# Patient Record
Sex: Male | Born: 1965 | Race: Black or African American | Hispanic: No | Marital: Married | Smoking: Never smoker
Health system: Southern US, Community
[De-identification: ages and names within clinical notes are randomized; demographics above are authoritative.]

## PROBLEM LIST (undated history)

## (undated) DIAGNOSIS — N4340 Spermatocele of epididymis, unspecified: Secondary | ICD-10-CM

## (undated) HISTORY — PX: SPERMATOCELECTOMY: SHX2420

---

## 2004-10-28 ENCOUNTER — Ambulatory Visit (HOSPITAL_COMMUNITY): Admission: RE | Admit: 2004-10-28 | Discharge: 2004-10-28 | Payer: Self-pay | Admitting: Neurosurgery

## 2005-10-19 ENCOUNTER — Emergency Department (HOSPITAL_COMMUNITY): Admission: EM | Admit: 2005-10-19 | Discharge: 2005-10-19 | Payer: Self-pay | Admitting: Emergency Medicine

## 2007-10-19 ENCOUNTER — Ambulatory Visit (HOSPITAL_COMMUNITY): Admission: RE | Admit: 2007-10-19 | Discharge: 2007-10-19 | Payer: Self-pay | Admitting: Internal Medicine

## 2011-02-07 ENCOUNTER — Emergency Department (HOSPITAL_COMMUNITY): Payer: 59

## 2011-02-07 ENCOUNTER — Emergency Department (HOSPITAL_COMMUNITY)
Admission: EM | Admit: 2011-02-07 | Discharge: 2011-02-07 | Disposition: A | Discharge status: Home / Self Care | Payer: 59 | Attending: Emergency Medicine | Admitting: Emergency Medicine

## 2011-02-07 DIAGNOSIS — R112 Nausea with vomiting, unspecified: Secondary | ICD-10-CM | POA: Insufficient documentation

## 2011-02-07 DIAGNOSIS — R1013 Epigastric pain: Secondary | ICD-10-CM | POA: Insufficient documentation

## 2011-02-07 LAB — COMPREHENSIVE METABOLIC PANEL
AST: 23 U/L (ref 0–37)
Alkaline Phosphatase: 58 U/L (ref 39–117)
BUN: 13 mg/dL (ref 6–23)
CO2: 30 mEq/L (ref 19–32)
Calcium: 9.8 mg/dL (ref 8.4–10.5)
Chloride: 104 mEq/L (ref 96–112)
Glucose, Bld: 98 mg/dL (ref 70–99)
Potassium: 3.8 mEq/L (ref 3.5–5.1)
Sodium: 140 mEq/L (ref 135–145)
Total Bilirubin: 0.6 mg/dL (ref 0.3–1.2)
Total Protein: 7.9 g/dL (ref 6.0–8.3)

## 2011-02-07 LAB — URINALYSIS, ROUTINE W REFLEX MICROSCOPIC
Ketones, ur: NEGATIVE mg/dL
Leukocytes, UA: NEGATIVE
Protein, ur: 30 mg/dL — AB
Specific Gravity, Urine: 1.022 (ref 1.005–1.030)
Urobilinogen, UA: 0.2 mg/dL (ref 0.0–1.0)

## 2011-02-07 LAB — CBC
HCT: 42.8 % (ref 39.0–52.0)
Hemoglobin: 14.1 g/dL (ref 13.0–17.0)
MCV: 80.6 fL (ref 78.0–100.0)
Platelets: 185 10*3/uL (ref 150–400)
RBC: 5.31 MIL/uL (ref 4.22–5.81)

## 2011-02-07 LAB — DIFFERENTIAL
Basophils Relative: 0 % (ref 0–1)
Lymphocytes Relative: 18 % (ref 12–46)
Monocytes Relative: 7 % (ref 3–12)

## 2011-02-07 LAB — URINE MICROSCOPIC-ADD ON

## 2013-03-02 IMAGING — US US ABDOMEN COMPLETE
1 series · 14 of 25 positions shown · non-contrast
Comparison: None.

CLINICAL DATA: Epigastric pain and nausea and vomiting.

COMPLETE ABDOMINAL ULTRASOUND

[Series 1: us abdomen complete · 0.28mm/px · 14 of 54 slices shown]
[im 1/54]
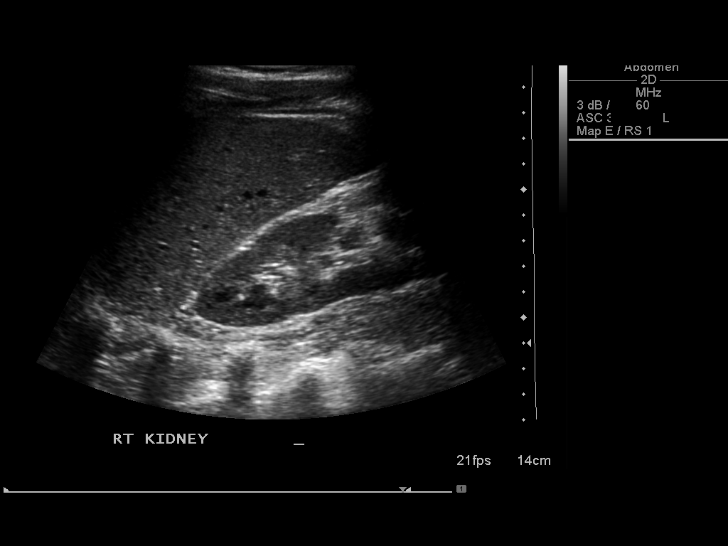
[im 5/54]
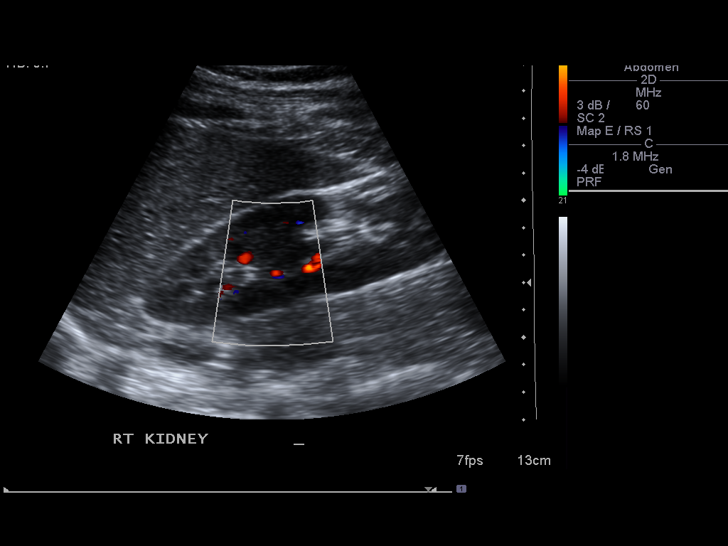
[im 9/54]
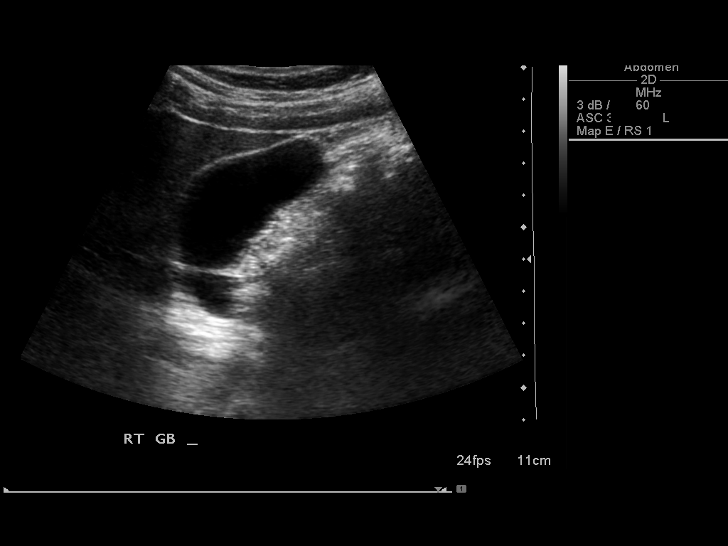
[im 14/54]
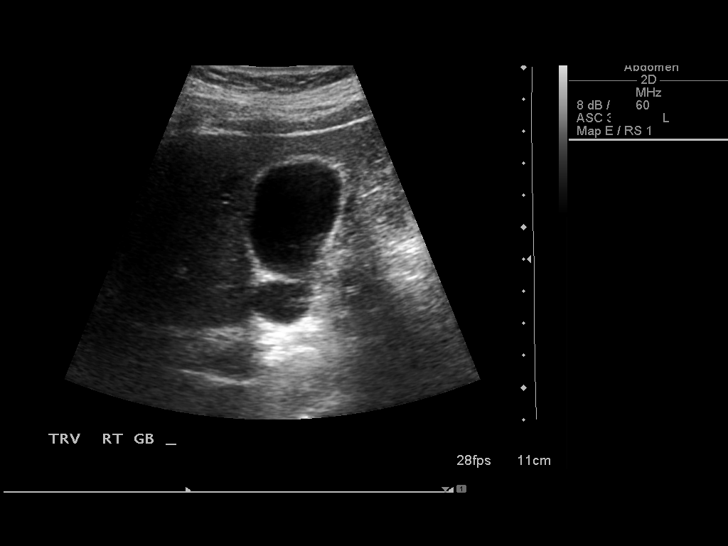
[im 18/54]
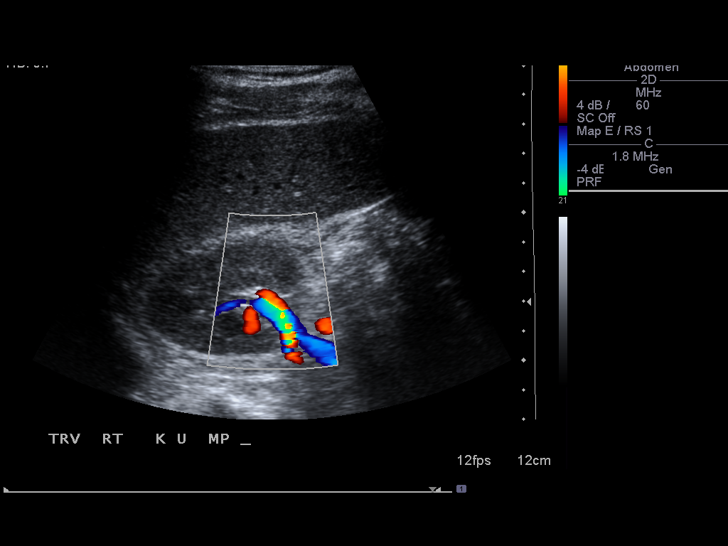
[im 20/54]
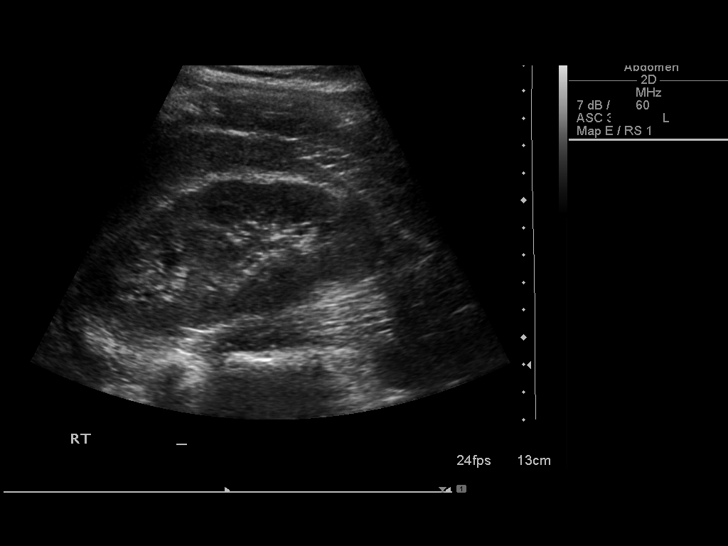
[im 25/54]
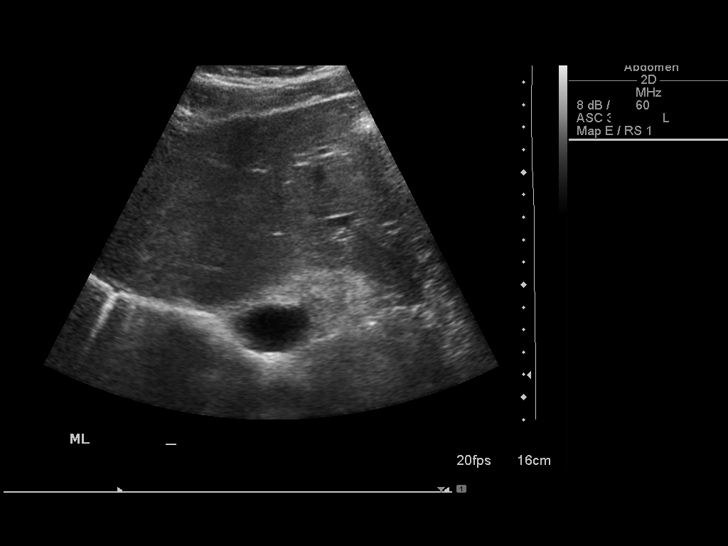
[im 29/54]
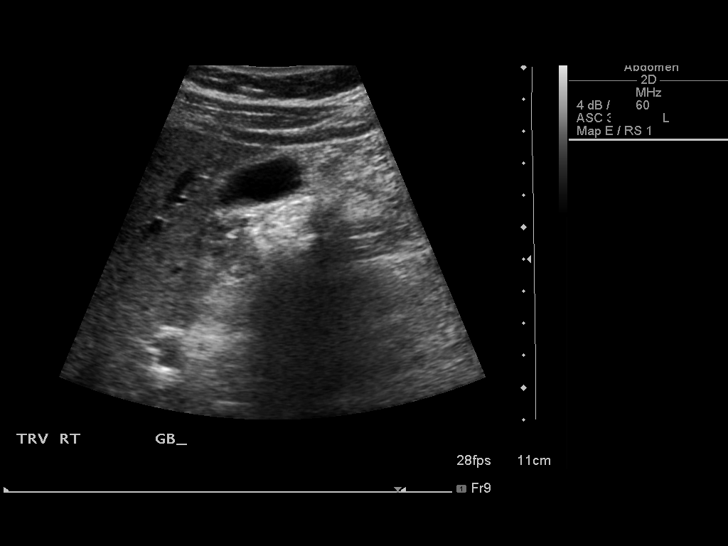
[im 34/54]
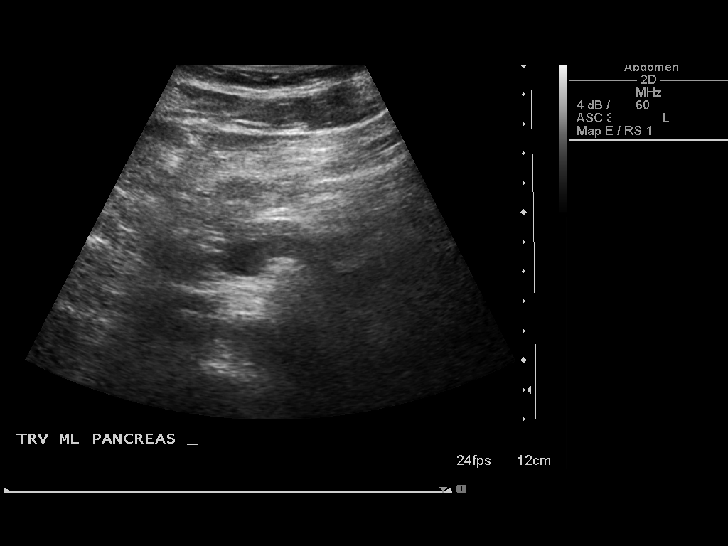
[im 36/54]
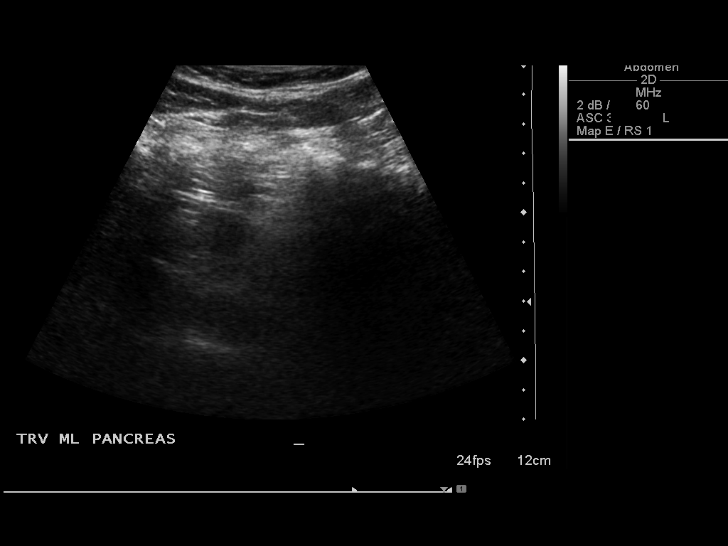
[im 40/54]
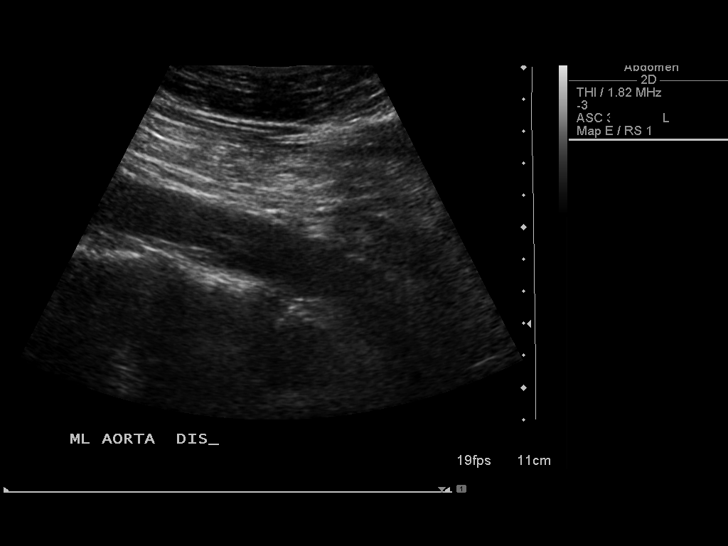
[im 45/54]
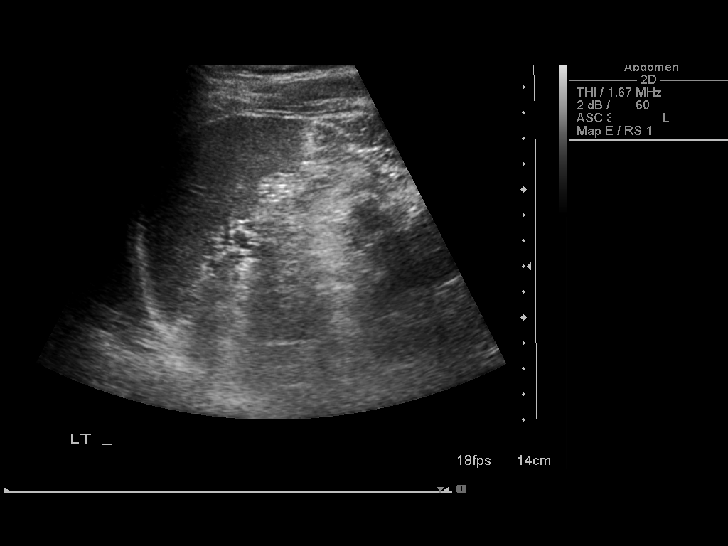
[im 49/54]
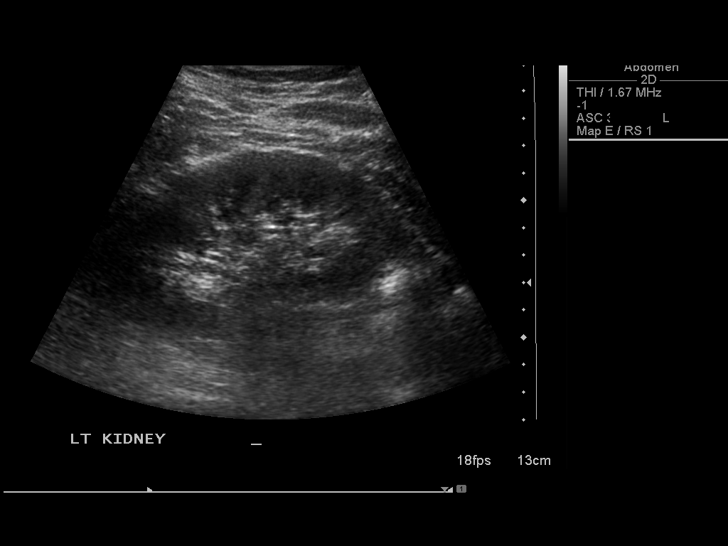
[im 54/54]
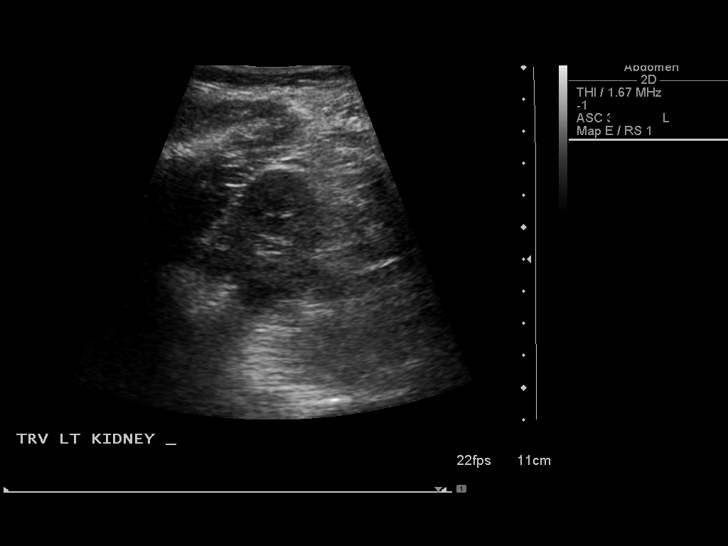

[14 of 25 positions shown; findings below may reference images not displayed]

FINDINGS: Gallbladder:  No gallstones, gallbladder wall thickening, or
pericholecystic fluid. Negative sonographic Murphy's sign.

Common bile duct:  Normal.  3 mm in diameter.

Liver:  No focal lesion identified.  Within normal limits in
parenchymal echogenicity.

IVC:  Appears normal.

Pancreas:  Head and body of the pancreas are normal.  The tail is
obscured by bowel gas.

Spleen:  Normal.  6.9 cm in length.

Right Kidney:  Normal.  11.6 cm in length.

Left Kidney:  12.5 cm in length.

Abdominal aorta:  Normal.  2.1 cm maximum diameter.
IMPRESSION: Normal exam.  The tail the pancreas is not visualized.

## 2019-06-22 ENCOUNTER — Other Ambulatory Visit: Payer: Self-pay

## 2021-12-28 ENCOUNTER — Other Ambulatory Visit (HOSPITAL_BASED_OUTPATIENT_CLINIC_OR_DEPARTMENT_OTHER): Payer: Self-pay

## 2022-08-25 ENCOUNTER — Other Ambulatory Visit: Payer: Self-pay

## 2022-08-25 ENCOUNTER — Encounter (HOSPITAL_BASED_OUTPATIENT_CLINIC_OR_DEPARTMENT_OTHER): Payer: Self-pay

## 2022-08-25 ENCOUNTER — Emergency Department (HOSPITAL_BASED_OUTPATIENT_CLINIC_OR_DEPARTMENT_OTHER)
Admission: EM | Admit: 2022-08-25 | Discharge: 2022-08-25 | Disposition: A | Discharge status: Home / Self Care | Payer: Managed Care, Other (non HMO) | Attending: Emergency Medicine | Admitting: Emergency Medicine

## 2022-08-25 DIAGNOSIS — R109 Unspecified abdominal pain: Secondary | ICD-10-CM | POA: Diagnosis present

## 2022-08-25 DIAGNOSIS — R1084 Generalized abdominal pain: Secondary | ICD-10-CM | POA: Insufficient documentation

## 2022-08-25 DIAGNOSIS — R112 Nausea with vomiting, unspecified: Secondary | ICD-10-CM | POA: Insufficient documentation

## 2022-08-25 HISTORY — DX: Spermatocele of epididymis, unspecified: N43.40

## 2022-08-25 LAB — CBC WITH DIFFERENTIAL/PLATELET
Abs Immature Granulocytes: 0.01 10*3/uL (ref 0.00–0.07)
Basophils Absolute: 0 10*3/uL (ref 0.0–0.1)
Basophils Relative: 0 %
Eosinophils Absolute: 0 10*3/uL (ref 0.0–0.5)
Eosinophils Relative: 0 %
HCT: 44.3 % (ref 39.0–52.0)
Hemoglobin: 14.1 g/dL (ref 13.0–17.0)
Immature Granulocytes: 0 %
Lymphocytes Relative: 15 %
Lymphs Abs: 0.8 10*3/uL (ref 0.7–4.0)
MCH: 26.3 pg (ref 26.0–34.0)
MCHC: 31.8 g/dL (ref 30.0–36.0)
MCV: 82.5 fL (ref 80.0–100.0)
Monocytes Absolute: 0.5 10*3/uL (ref 0.1–1.0)
Monocytes Relative: 9 %
Neutro Abs: 3.8 10*3/uL (ref 1.7–7.7)
Neutrophils Relative %: 76 %
Platelets: 173 10*3/uL (ref 150–400)
RBC: 5.37 MIL/uL (ref 4.22–5.81)
RDW: 13.5 % (ref 11.5–15.5)
WBC: 5.1 10*3/uL (ref 4.0–10.5)
nRBC: 0 % (ref 0.0–0.2)

## 2022-08-25 LAB — COMPREHENSIVE METABOLIC PANEL
ALT: 36 U/L (ref 0–44)
AST: 30 U/L (ref 15–41)
Albumin: 4.6 g/dL (ref 3.5–5.0)
Alkaline Phosphatase: 43 U/L (ref 38–126)
Anion gap: 8 (ref 5–15)
BUN: 12 mg/dL (ref 6–20)
CO2: 27 mmol/L (ref 22–32)
Calcium: 9.4 mg/dL (ref 8.9–10.3)
Chloride: 106 mmol/L (ref 98–111)
Creatinine, Ser: 1.08 mg/dL (ref 0.61–1.24)
GFR, Estimated: >60 mL/min (ref >60)
Glucose, Bld: 114 mg/dL — ABNORMAL HIGH (ref 70–99)
Potassium: 3.7 mmol/L (ref 3.5–5.1)
Sodium: 141 mmol/L (ref 135–145)
Total Bilirubin: 0.3 mg/dL (ref 0.3–1.2)
Total Protein: 7.5 g/dL (ref 6.5–8.1)

## 2022-08-25 LAB — LIPASE, BLOOD: Lipase: 13 U/L (ref 11–51)

## 2022-08-25 MED ORDER — MORPHINE SULFATE (PF) 4 MG/ML IV SOLN
4.0000 mg | Freq: Once | INTRAVENOUS | Status: AC
  Administered 2022-08-25: 4 mg via INTRAVENOUS
  Filled 2022-08-25: qty 1

## 2022-08-25 MED ORDER — ONDANSETRON HCL 4 MG/2ML IJ SOLN
4.0000 mg | Freq: Once | INTRAMUSCULAR | Status: AC
  Administered 2022-08-25: 4 mg via INTRAVENOUS
  Filled 2022-08-25: qty 2

## 2022-08-25 MED ORDER — DICYCLOMINE HCL 20 MG PO TABS: 20.0000 mg | ORAL_TABLET | Freq: Two times a day (BID) | ORAL | 1 refills | Status: AC

## 2022-08-25 MED ORDER — SODIUM CHLORIDE 0.9 % IV BOLUS
1000.0000 mL | Freq: Once | INTRAVENOUS | Status: AC
  Administered 2022-08-25: 1000 mL via INTRAVENOUS

## 2022-08-25 NOTE — ED Notes (Signed)
Patient verbalizes understanding of discharge instructions. Opportunity for questioning and answers were provided. Armband removed by staff, pt discharged from ED. Ambulated out to lobby with wife

## 2022-08-25 NOTE — ED Provider Notes (Signed)
[facility] Provider Note   CSN: [id number] Arrival date & time: 08/25/22  1618     History  Chief Complaint  Patient presents with   COVID +   Abdominal Pain    Steven Young is a 57 y.o. male with a past medical history of hyperlipidemia presenting today with complaint of generalized abdominal pain that is worse in the epigastrium.  He reports that the pain started on Sunday.  On Saturday he had URI symptoms and was diagnosed with COVID.  He was prescribed Paxlovid and shortly after taking the Paxlovid dose he started to experience some abdominal pain.  At that time he was having no nausea, vomiting or diarrhea.  He has continued to have normal stools up until earlier today when he had some episodes of loose stools that he does not describe this diarrhea.  Also reports that while he was out of town over the weekend he ate large amounts of spicy food which she normally does not.  Follows with Dr. Collene Mares with GI and had a colonoscopy last year that was normal.  Reports having an endoscopy 10 years ago that showed gastritis and he has had multiple flares of that since then.  Reports that he tried to take Prilosec this morning when the pain came on but this did not help him.  He took Mylanta yesterday and today which somewhat helped him.  He awoke from a nap today with more severe pain and decided to come to the emergency department.  No NSAIDs or alcohol.  Has not been taking any new medications outside of the Paxlovid and NyQuil.  Abdominal Pain Associated symptoms: nausea and vomiting   Associated symptoms: no constipation and no diarrhea        Home Medications Prior to Admission medications   Medication Sig Start Date End Date Taking? Authorizing Provider  rosuvastatin (CRESTOR) 10 MG tablet Take 10 mg by mouth daily. 02/10/19   [provider]      Allergies    Patient has no allergy information on record.    Review of Systems    Review of Systems  Gastrointestinal:  Positive for abdominal pain, nausea and vomiting. Negative for blood in stool, constipation and diarrhea.    Physical Exam Updated Vital Signs BP (!) 154/103 (BP Location: Left Arm)   Pulse 74   Temp 97.6 F (36.4 C) (Oral)   Resp 18   SpO2 95%  Physical Exam Vitals and nursing note reviewed.  Constitutional:      Appearance: Normal appearance.  HENT:     Head: Normocephalic and atraumatic.  Eyes:     General: No scleral icterus.    Conjunctiva/sclera: Conjunctivae normal.  Pulmonary:     Effort: Pulmonary effort is normal. No respiratory distress.  Abdominal:     General: Abdomen is flat.     Palpations: Abdomen is soft.     Tenderness: There is generalized abdominal tenderness.     Hernia: No hernia is present.  Skin:    General: Skin is warm and dry.     Findings: No rash.  Neurological:     Mental Status: He is alert.  Psychiatric:        Mood and Affect: Mood normal.     ED Results / Procedures / Treatments   Labs (all labs ordered are listed, but only abnormal results are displayed) Labs Reviewed - No data to display  EKG None  Radiology No results found.  Procedures Procedures   Medications Ordered in ED Medications - No data to display  ED Course/ Medical Decision Making/ A&P Clinical Course as of 08/25/22 1859  Wed Aug 25, 2022  1755 Reevaluated and reports feeling much better. [MR]    Clinical Course User Index [MR] Jacquise Rarick, Cecilio Asper, PA-C                             Medical Decision Making Amount and/or Complexity of Data Reviewed Labs: ordered.  Risk Prescription drug management.   57 year old male presenting today with abdominal pain. The differential diagnosis for generalized abdominal pain includes, but is not limited to AAA, gastroenteritis, appendicitis, Bowel obstruction, Bowel perforation. Gastroparesis, DKA, Hernia, Inflammatory bowel disease, mesenteric ischemia, pancreatitis,  peritonitis SBP, volvulus.   This is not an exhaustive differential.    Past Medical History / Co-morbidities / Social History: Hyperlipidemia   Physical Exam: Pertinent physical exam findings include No focal abdominal tenderness.  Appears more generalized  Lab Tests: I ordered, and personally interpreted labs.  The pertinent results include: No abnormalities   Imaging Studies: I engaged in shared decision-making with the patient and his wife about CT imaging.  We agreed that at this time due to no focal tenderness and more generalized symptoms we will try and treat his symptoms instead of pursuing imaging.   Medications: I ordered medication including Zofran, morphine and fluids. Reevaluation of the patient after these medicines showed that the patient improved. I have reviewed the patients home medicines and have made adjustments as needed.    MDM/Disposition: This is a 57 year old male presenting today with abdominal pain.  Is intermittent.  Started shortly after starting Paxlovid however also may be secondary to something that he ate.  No focal tenderness on abdominal exam.  Patient and his wife are agreeable to trying to treat his symptoms and follow labs without imaging at this time.  Lab work benign.  No signs of organ dysfunction and normal WBC count.  Feels back to normal after medication treatment.  I reviewed Paxlovid and it appears the most common side effects include abdominal pain and diarrhea.  Suspect that his symptoms are secondary to COVID and potential drug side effect.  He does not appear to have an emergent condition requiring any further workup.  He and his wife are agreeable to discharge without any imaging or further test.  Return precautions discussed and if his symptoms continue past his COVID illness and Paxlovid use, he has been instructed to follow-up with Dr. Collene Mares with GI.   Final Clinical Impression(s) / ED Diagnoses Final diagnoses:  Generalized abdominal  pain    Rx / DC Orders ED Discharge Orders     None         Rhae Hammock, PA-C 08/25/22 1859    Fransico Meadow, MD 08/29/22 [phone]

## 2022-08-25 NOTE — ED Notes (Signed)
ED Provider at bedside. 

## 2022-08-25 NOTE — Discharge Instructions (Addendum)
You came to the emergency department today with abdominal pain.  We were successfully able to treat your symptoms.  Be sure to maintain a very bland diet free of fried, fatty and spicy foods for the time being.  Also be sure to take any medications that you take over-the-counter for your COVID symptoms with food.  If you continue to experience abdominal pain past the duration of your COVID, make an appointment with Dr. Collene Mares.  With any worsening or recurring symptoms please return to the emergency department without hesitation.  It was a pleasure to meet you and we hope you feel better!

## 2022-08-25 NOTE — ED Triage Notes (Signed)
He c/o "congestion" since this past Sat. He tested positive for COVID this Sunday; and he has had some generalized abd. Pain since Sunday also.
